# Patient Record
Sex: Female | Born: 1985 | Race: White | Hispanic: Yes | Marital: Single | Smoking: Never smoker
Health system: Southern US, Community
[De-identification: ages and names within clinical notes are randomized; demographics above are authoritative.]

---

## 2006-05-05 ENCOUNTER — Inpatient Hospital Stay (HOSPITAL_COMMUNITY): Admission: AD | Admit: 2006-05-05 | Discharge: 2006-05-05 | Payer: Self-pay | Admitting: Obstetrics and Gynecology

## 2006-07-22 ENCOUNTER — Ambulatory Visit (HOSPITAL_COMMUNITY): Admission: RE | Admit: 2006-07-22 | Discharge: 2006-07-22 | Payer: Self-pay | Admitting: Obstetrics & Gynecology

## 2006-08-09 ENCOUNTER — Inpatient Hospital Stay (HOSPITAL_COMMUNITY): Admission: AD | Admit: 2006-08-09 | Discharge: 2006-08-09 | Payer: Self-pay | Admitting: Obstetrics & Gynecology

## 2006-11-24 ENCOUNTER — Inpatient Hospital Stay (HOSPITAL_COMMUNITY): Admission: AD | Admit: 2006-11-24 | Discharge: 2006-11-24 | Payer: Self-pay | Admitting: Obstetrics and Gynecology

## 2006-11-24 ENCOUNTER — Ambulatory Visit: Payer: Self-pay | Admitting: Obstetrics and Gynecology

## 2006-11-27 ENCOUNTER — Ambulatory Visit: Payer: Self-pay | Admitting: Family Medicine

## 2006-11-29 ENCOUNTER — Ambulatory Visit: Payer: Self-pay | Admitting: Family Medicine

## 2006-11-29 ENCOUNTER — Inpatient Hospital Stay (HOSPITAL_COMMUNITY): Admission: AD | Admit: 2006-11-29 | Discharge: 2006-12-03 | Payer: Self-pay | Admitting: Obstetrics and Gynecology

## 2007-12-29 ENCOUNTER — Ambulatory Visit: Payer: Self-pay | Admitting: Family Medicine

## 2007-12-29 ENCOUNTER — Encounter: Payer: Self-pay | Admitting: Family Medicine

## 2007-12-29 LAB — CONVERTED CEMR LAB
Antibody Screen: NEGATIVE
Basophils Absolute: 0 10*3/uL (ref 0.0–0.1)
Eosinophils Relative: 1 % (ref 0–5)
HCT: 33.9 % — ABNORMAL LOW (ref 36.0–46.0)
Hemoglobin: 11.5 g/dL — ABNORMAL LOW (ref 12.0–15.0)
MCHC: 33.9 g/dL (ref 30.0–36.0)
MCV: 89.9 fL (ref 78.0–100.0)
Monocytes Relative: 5 % (ref 3–12)
RBC: 3.77 M/uL — ABNORMAL LOW (ref 3.87–5.11)
RDW: 14.3 % (ref 11.5–15.5)
Rubella antibody, serum, titer: IMMUNE

## 2008-01-05 ENCOUNTER — Ambulatory Visit: Payer: Self-pay | Admitting: Family Medicine

## 2008-01-05 LAB — CONVERTED CEMR LAB

## 2008-01-06 ENCOUNTER — Encounter: Payer: Self-pay | Admitting: Family Medicine

## 2008-01-10 ENCOUNTER — Encounter: Payer: Self-pay | Admitting: Family Medicine

## 2008-01-27 ENCOUNTER — Ambulatory Visit: Payer: Self-pay | Admitting: Obstetrics & Gynecology

## 2008-01-27 ENCOUNTER — Inpatient Hospital Stay (HOSPITAL_COMMUNITY): Admission: AD | Admit: 2008-01-27 | Discharge: 2008-01-27 | Payer: Self-pay | Admitting: Obstetrics & Gynecology

## 2008-02-01 ENCOUNTER — Ambulatory Visit: Payer: Self-pay | Admitting: Family Medicine

## 2008-02-01 ENCOUNTER — Encounter: Payer: Self-pay | Admitting: Family Medicine

## 2008-02-01 LAB — CONVERTED CEMR LAB

## 2008-02-22 ENCOUNTER — Ambulatory Visit: Payer: Self-pay | Admitting: Family Medicine

## 2008-02-24 ENCOUNTER — Ambulatory Visit: Payer: Self-pay | Admitting: Family Medicine

## 2008-02-24 LAB — CONVERTED CEMR LAB: GTT, 1 hr: 97 mg/dL

## 2008-03-01 IMAGING — US US OB COMP LESS 14 WK
1 series · 14 of 20 positions shown · non-contrast
Comparison: none

CLINICAL DATA: Abdominal pain at approximately 15 weeks gestation.  

OBSTETRICAL ULTRASOUND <14 WKS AND TRANSVAGINAL OB US:
Number of Fetuses:  1
Heart Rate:  158 bpm
CRL:  3.9 cm   10 w 6 d   US EDC:  11/25/06
Fetal anatomy could not be evaluated due to the early gestational age.
MATERNAL UTERINE AND ADNEXAL FINDINGS
Cervix:  Closed; not evaluated.

[Series 1: us ob comp +14 wk · 14 of 20 slices shown]
[im 1/20]
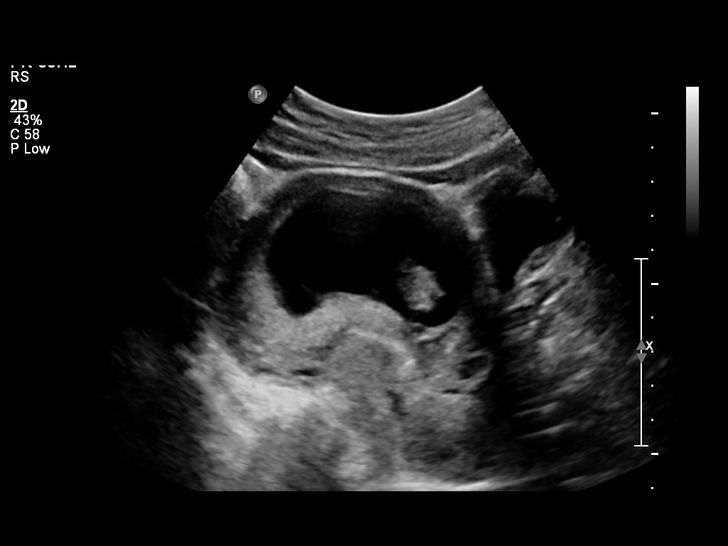
[im 3/20]
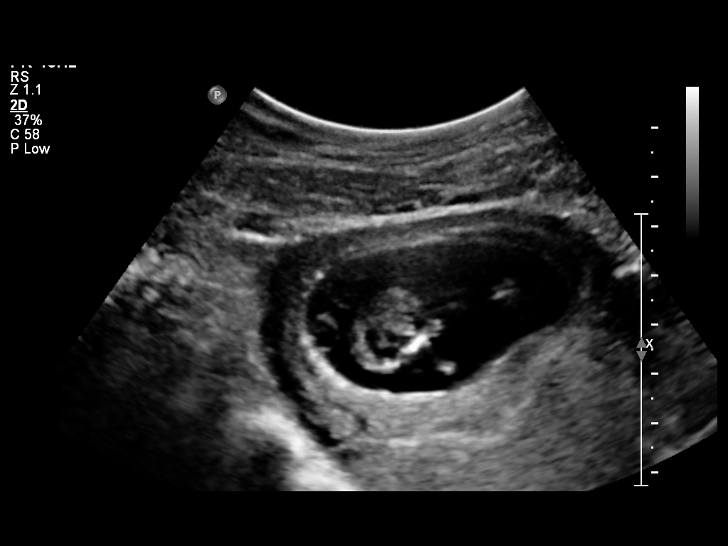
[im 4/20]
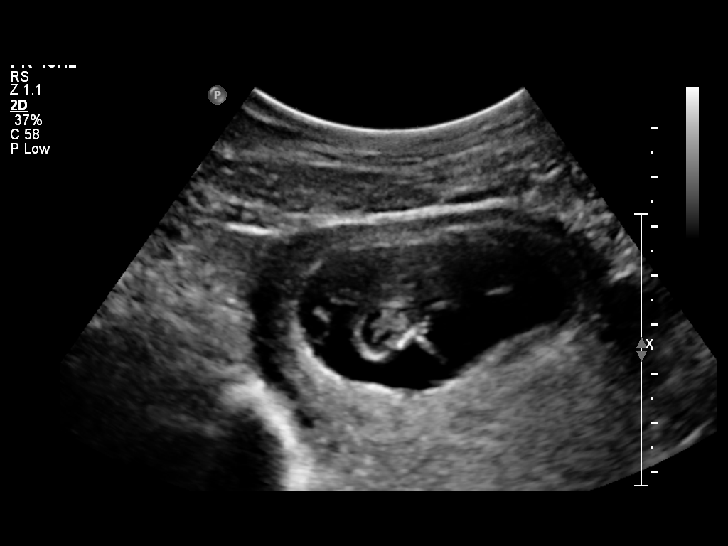
[im 6/20]
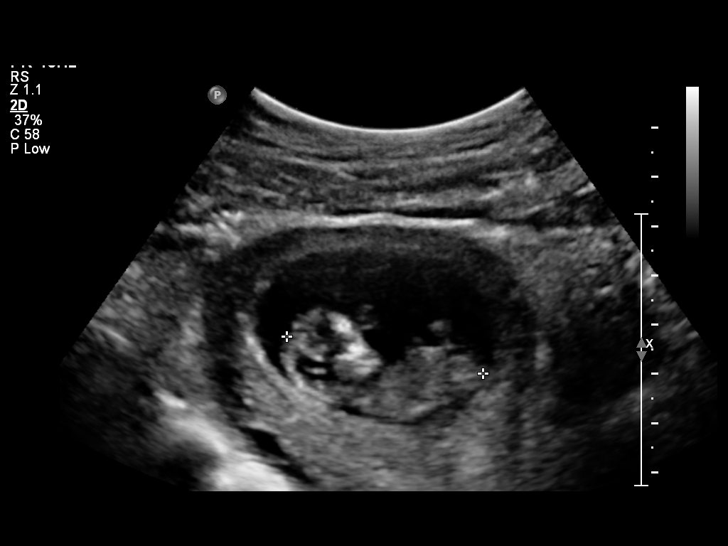
[im 7/20]
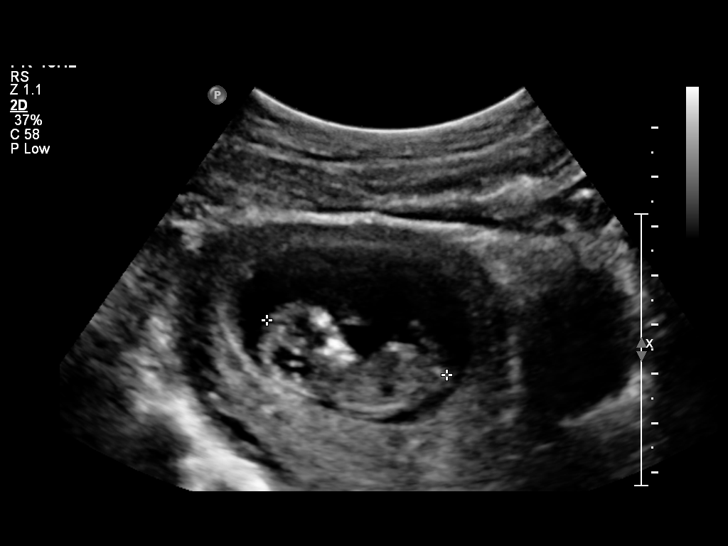
[im 8/20]
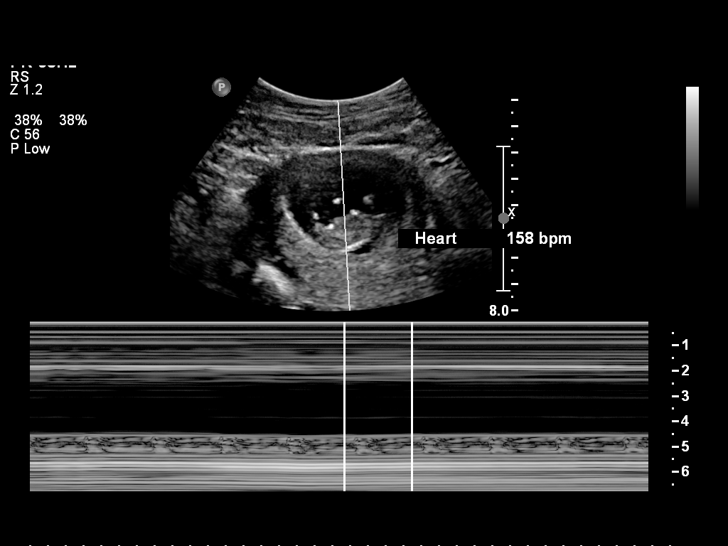
[im 10/20]
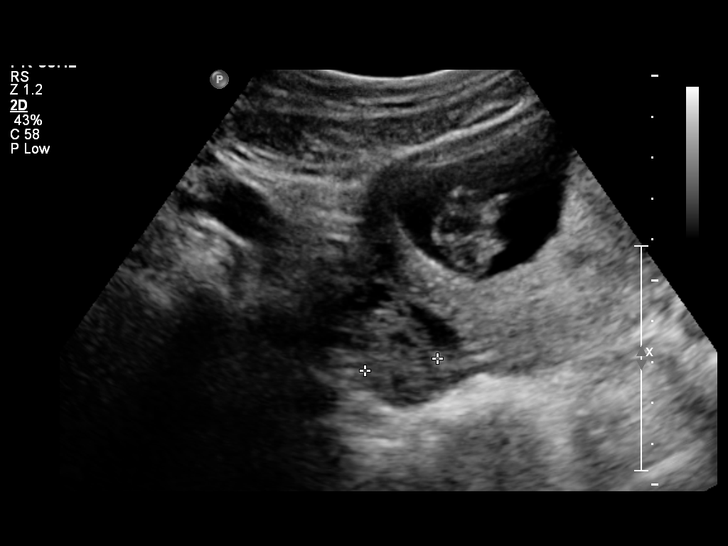
[im 11/20]
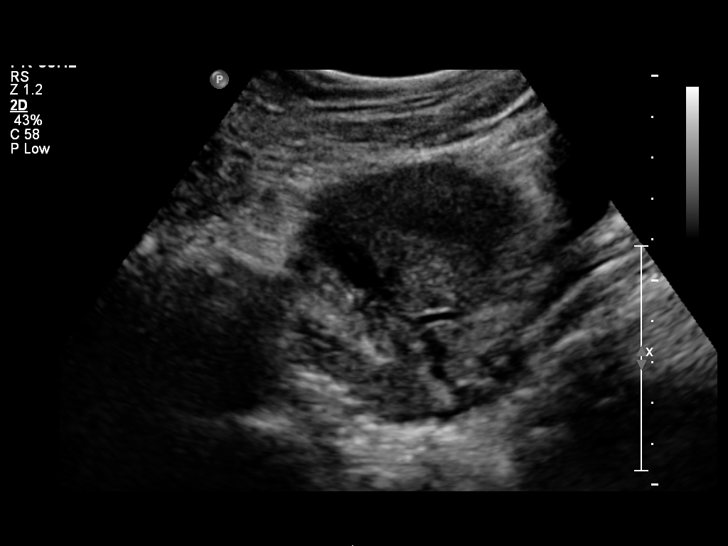
[im 13/20]
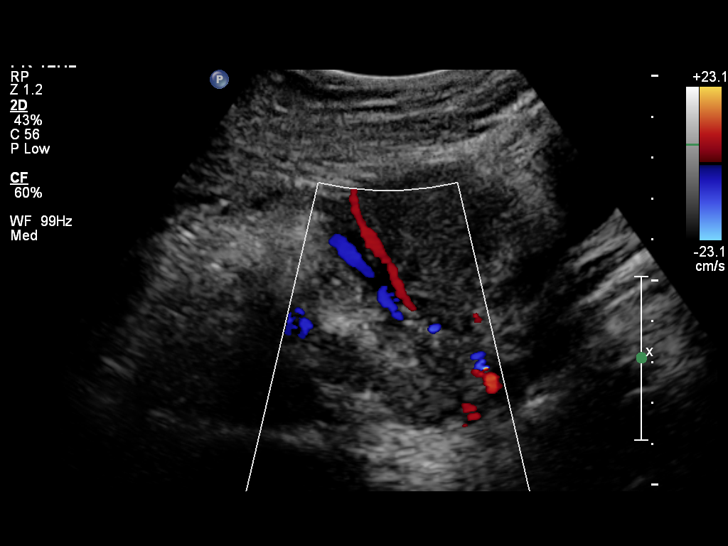
[im 14/20]
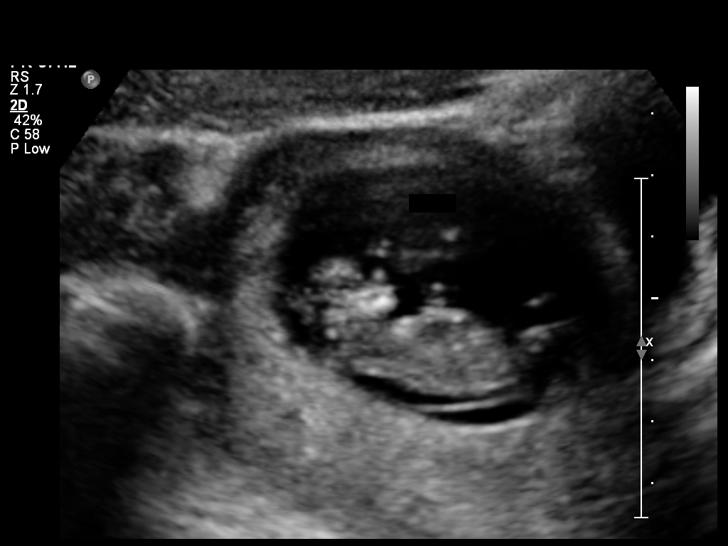
[im 16/20]
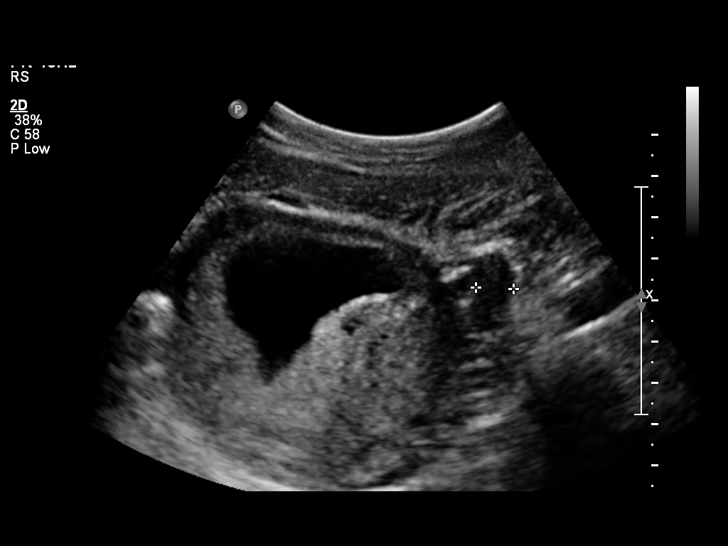
[im 17/20]
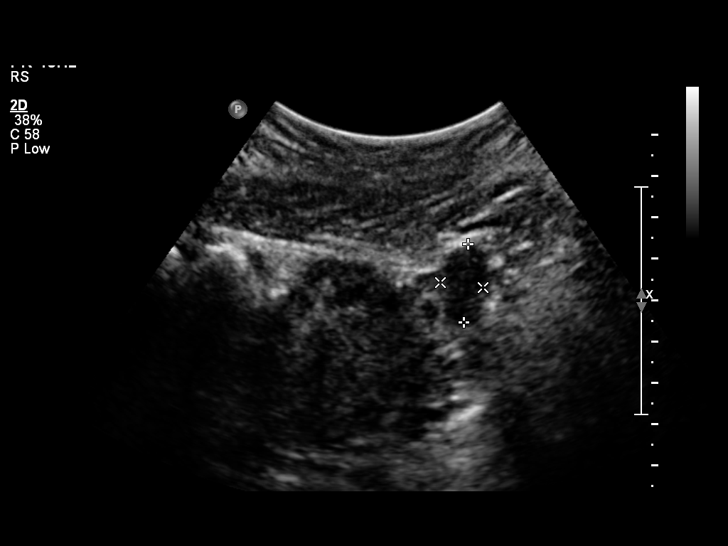
[im 18/20]
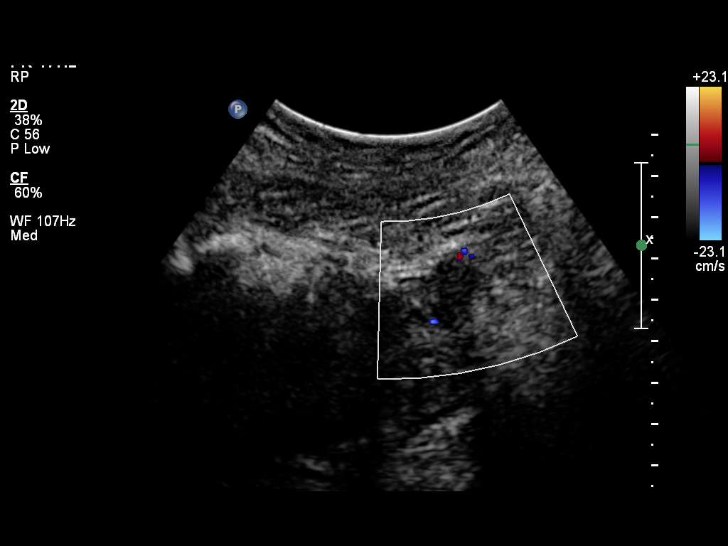
[im 20/20]
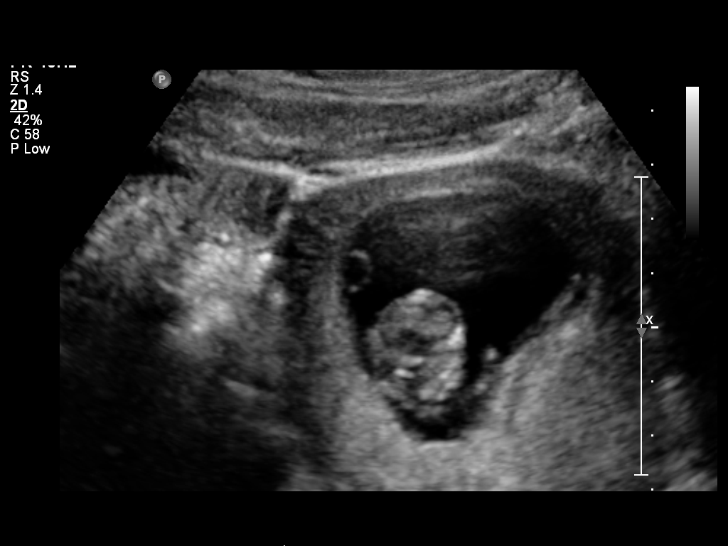

[14 of 20 positions shown; findings below may reference images not displayed]

IMPRESSION: 1.  Single living intrauterine gestation at a 10 week 6 day estimated gestational age by ultrasound.  Ultrasound EDC is 11/25/06.  This is almost 5 weeks behind the reported gestational age by LMP suggesting that LMP dating may be incorrect.  
2.  Follow-up ultrasound at 18 weeks gestation is recommended for dedicated anatomic assessment.

## 2008-03-10 ENCOUNTER — Ambulatory Visit: Payer: Self-pay | Admitting: Family Medicine

## 2008-03-27 ENCOUNTER — Encounter: Payer: Self-pay | Admitting: Family Medicine

## 2008-03-27 ENCOUNTER — Ambulatory Visit: Payer: Self-pay | Admitting: Family Medicine

## 2008-03-27 LAB — CONVERTED CEMR LAB
Bilirubin Urine: NEGATIVE
Blood in Urine, dipstick: NEGATIVE
Glucose, Urine, Semiquant: NEGATIVE
Ketones, urine, test strip: NEGATIVE
Nitrite: NEGATIVE
Protein, U semiquant: NEGATIVE
Specific Gravity, Urine: 1.025
Urobilinogen, UA: 0.2
pH: 5.5

## 2008-04-13 ENCOUNTER — Ambulatory Visit: Payer: Self-pay | Admitting: Family Medicine

## 2008-04-19 ENCOUNTER — Telehealth: Payer: Self-pay | Admitting: Family Medicine

## 2008-04-24 ENCOUNTER — Ambulatory Visit: Payer: Self-pay | Admitting: Family Medicine

## 2008-04-24 ENCOUNTER — Encounter: Payer: Self-pay | Admitting: Family Medicine

## 2008-04-24 LAB — CONVERTED CEMR LAB
Chlamydia, DNA Probe: NEGATIVE
GC Probe Amp, Genital: NEGATIVE

## 2008-05-03 ENCOUNTER — Ambulatory Visit: Payer: Self-pay | Admitting: Family Medicine

## 2008-05-11 ENCOUNTER — Ambulatory Visit: Payer: Self-pay | Admitting: Family Medicine

## 2008-05-18 ENCOUNTER — Ambulatory Visit: Payer: Self-pay | Admitting: Family Medicine

## 2008-05-18 IMAGING — US US OB COMP +14 WK
1 series · 14 of 28 positions shown · non-contrast
Comparison: none

OBSTETRICAL ULTRASOUND:

 This ultrasound exam was performed in the [HOSPITAL] Ultrasound Department.  The OB US report was generated in the AS system, and faxed to the ordering physician.  This report is also available in [REDACTED] PACS.

[Series 1: us ob comp +14 wk · 0.24mm/px · 14 of 76 slices shown]
[im 3/76]
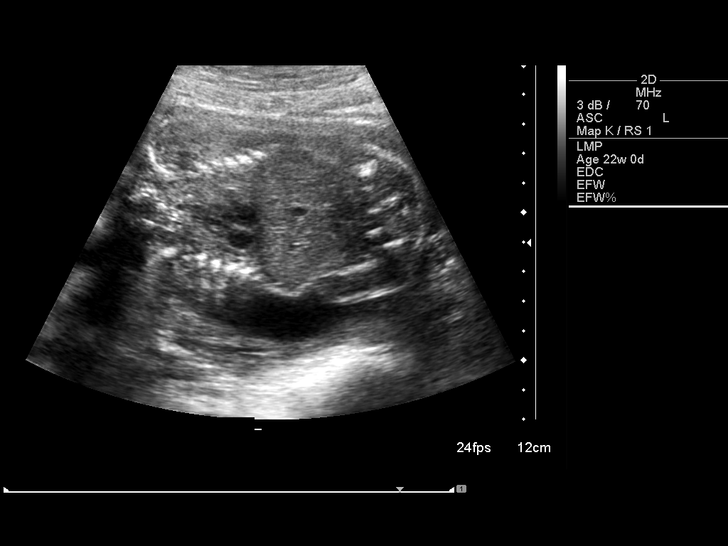
[im 9/76]
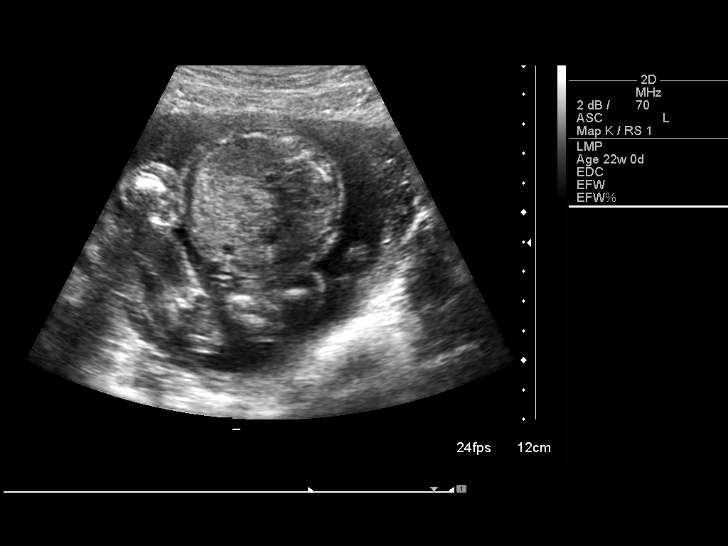
[im 14/76]
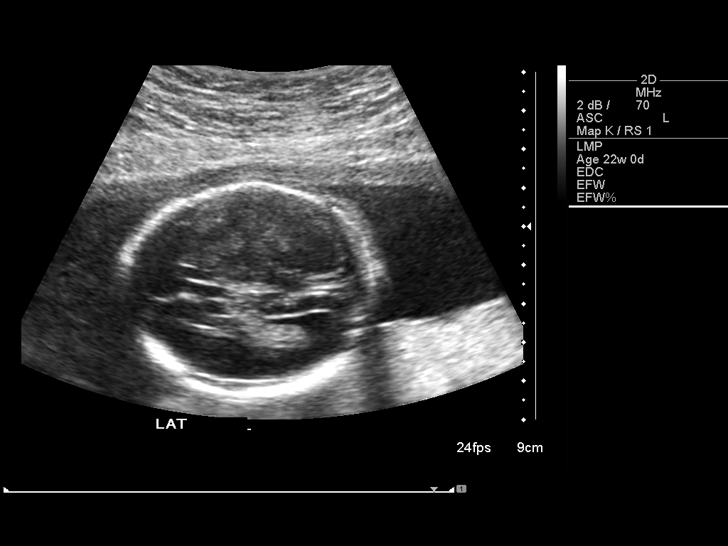
[im 20/76]
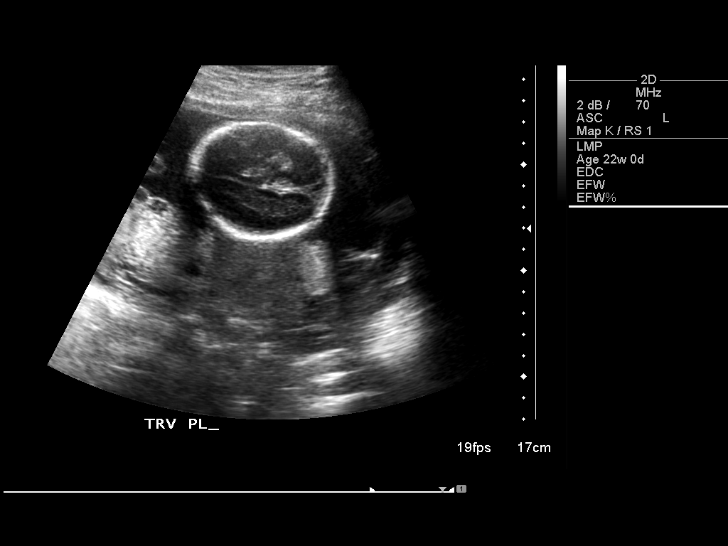
[im 26/76]
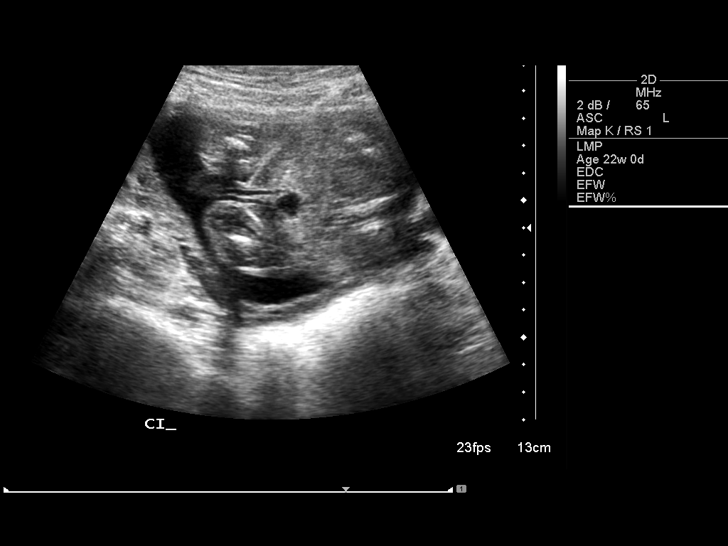
[im 31/76]
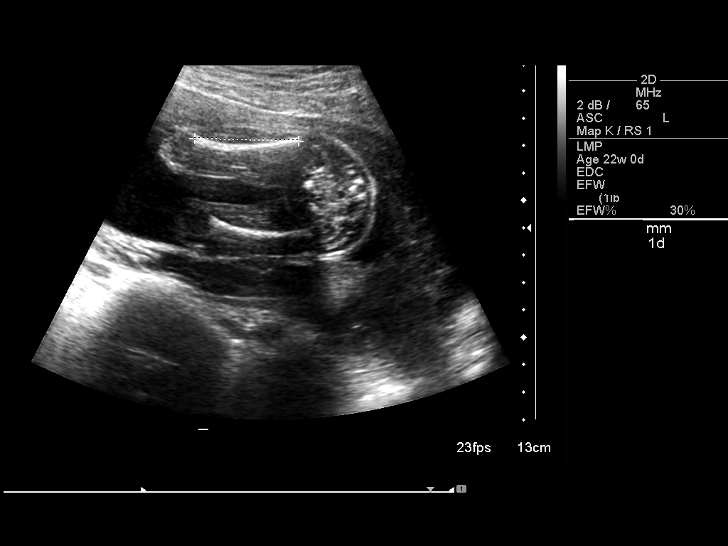
[im 37/76]
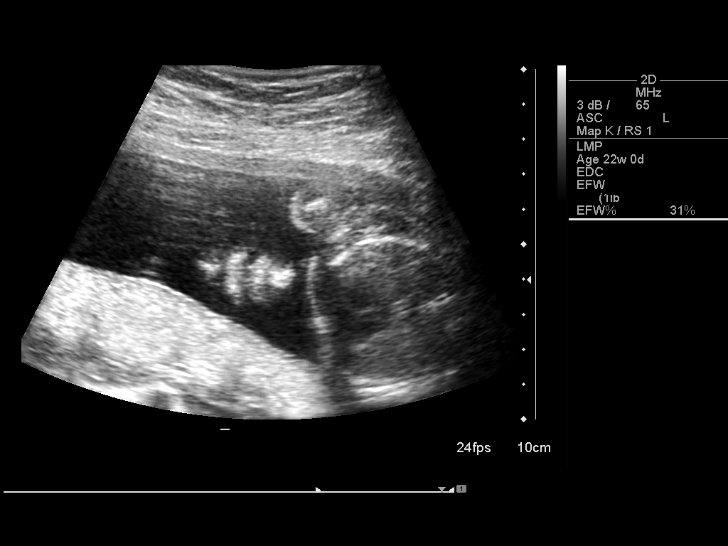
[im 42/76]
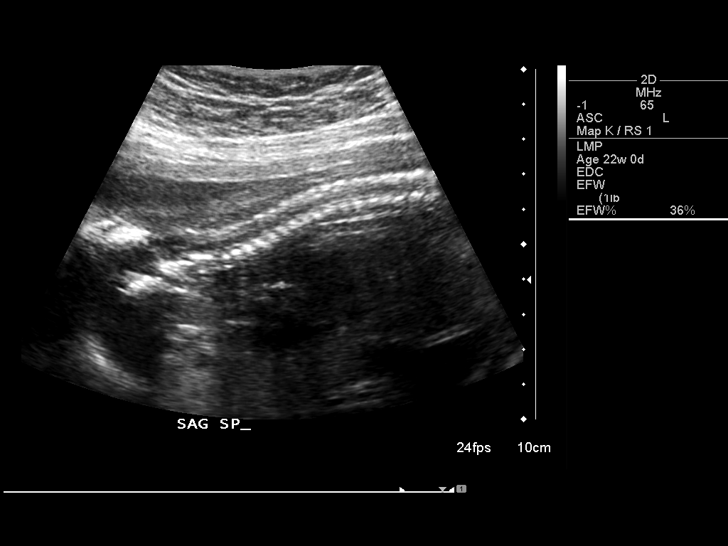
[im 48/76]
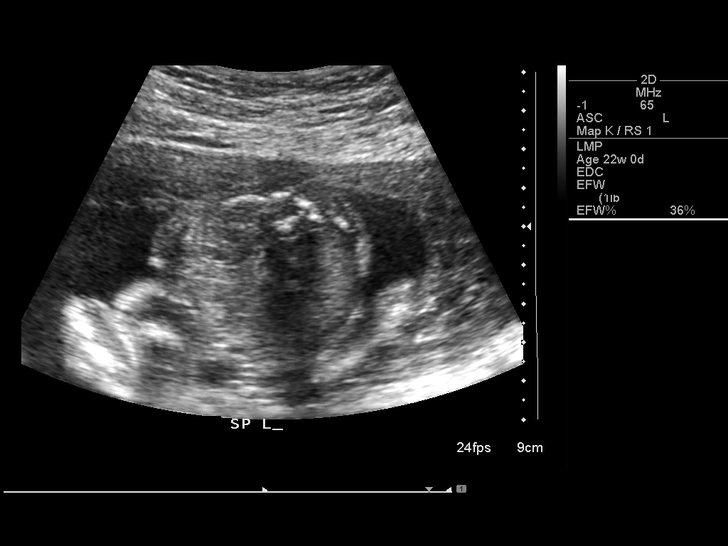
[im 53/76]
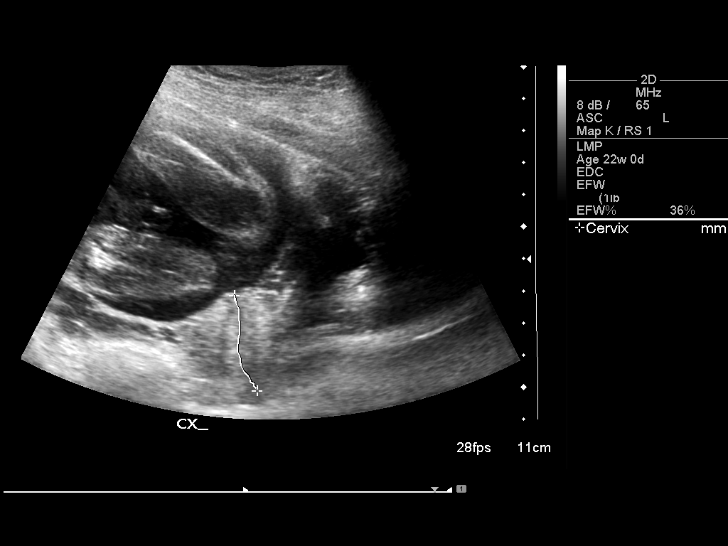
[im 59/76]
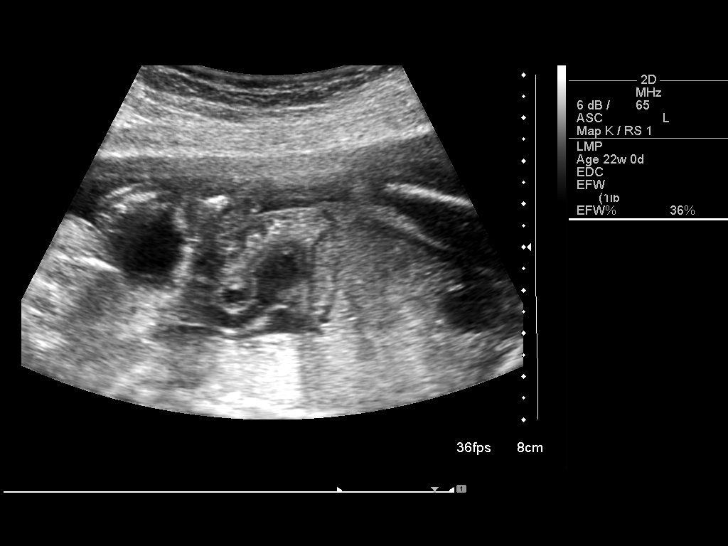
[im 64/76]
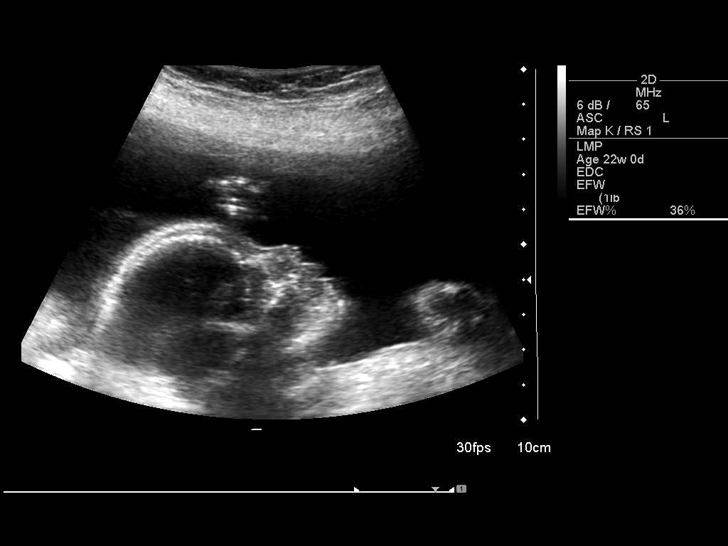
[im 70/76]
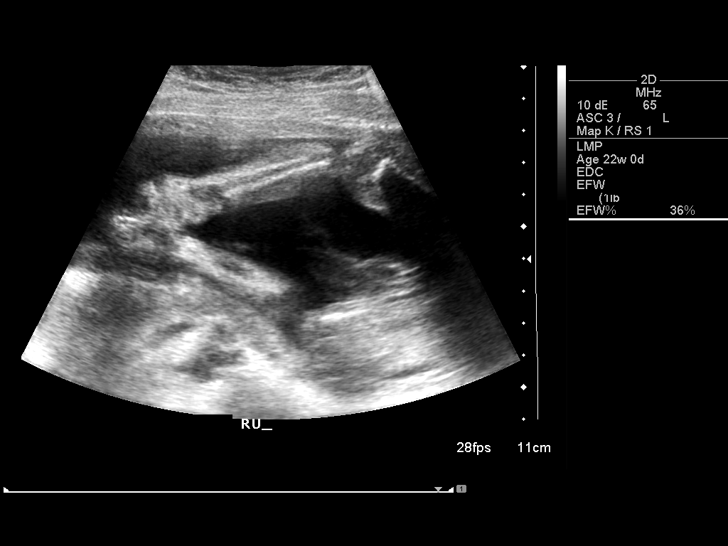
[im 76/76]
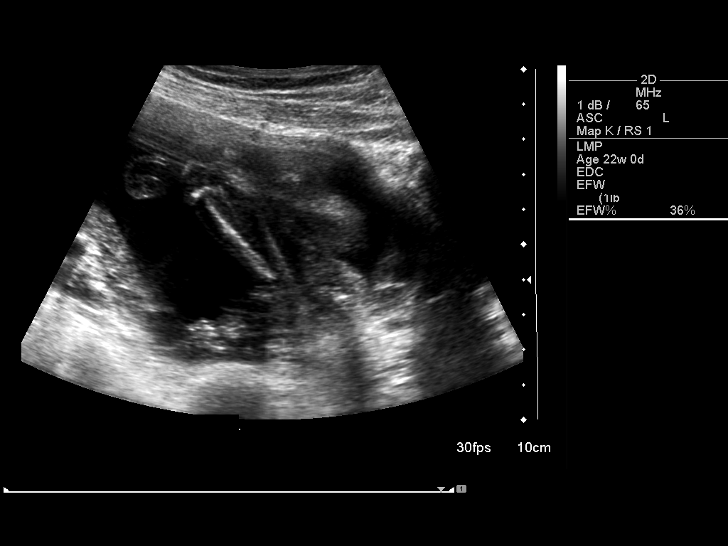

[14 of 28 positions shown; findings below may reference images not displayed]

IMPRESSION: See AS Obstetric US report.

## 2008-05-18 IMAGING — US US OB COMP +14 WK
1 series · 3 of 3 positions shown · non-contrast
Comparison: none

OBSTETRICAL ULTRASOUND:

 This ultrasound exam was performed in the [HOSPITAL] Ultrasound Department.  The OB US report was generated in the AS system, and faxed to the ordering physician.  This report is also available in [REDACTED] PACS.

[Series 1: us ob comp +14 wk · 0.22mm/px · 3 of 3 slices shown]
[im 1/3]
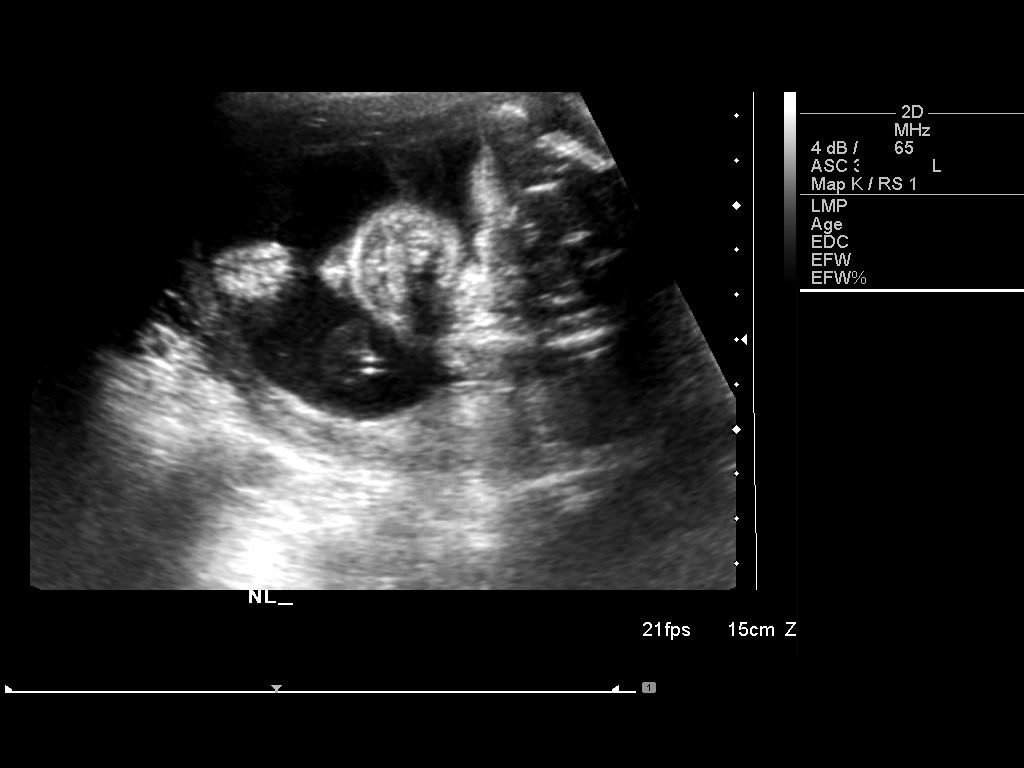
[im 2/3]
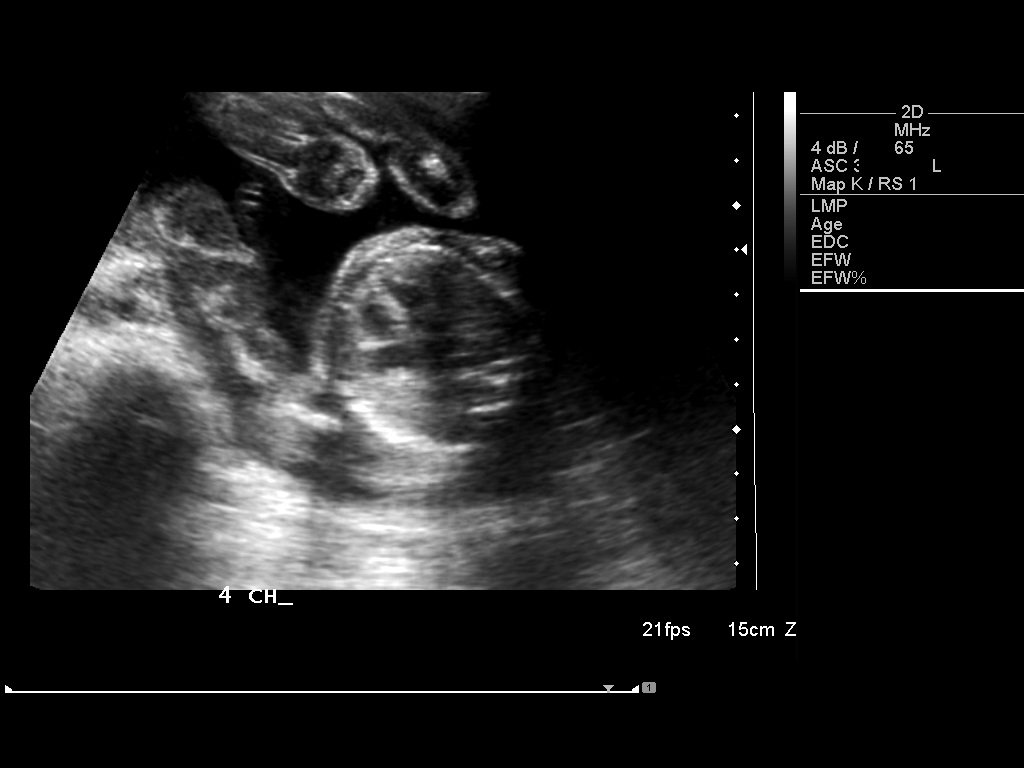
[im 3/3]
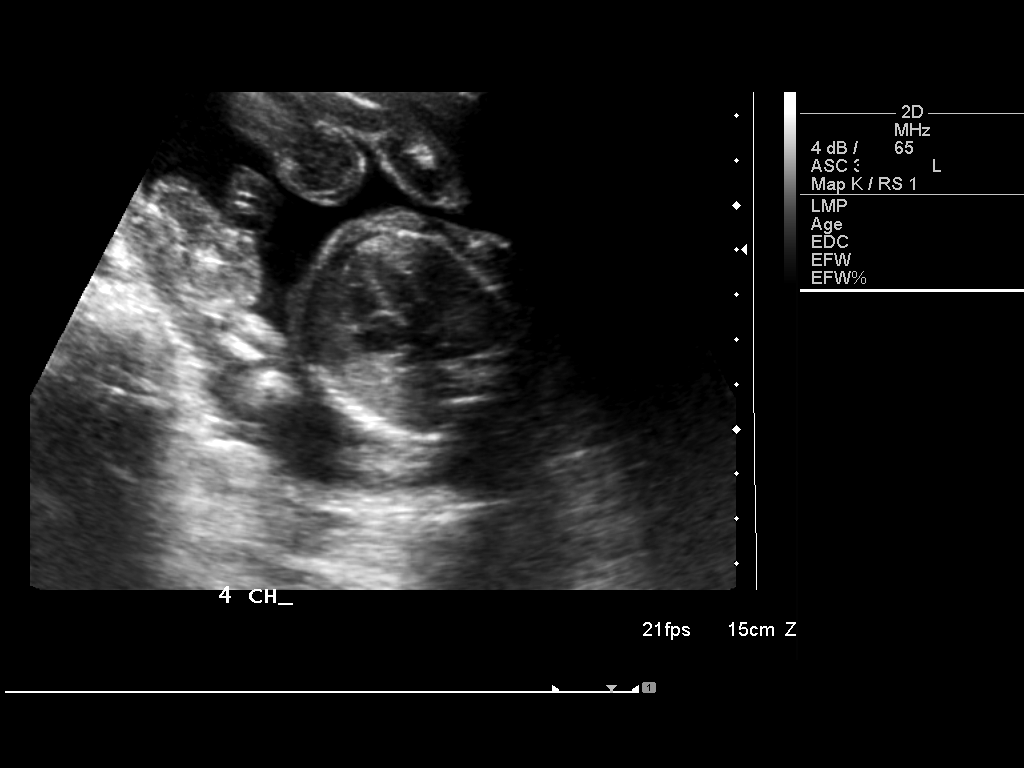

[3 of 3 positions shown; findings below may reference images not displayed]

IMPRESSION: See AS Obstetric US report.

## 2008-05-19 ENCOUNTER — Encounter: Payer: Self-pay | Admitting: Family Medicine

## 2008-05-19 ENCOUNTER — Ambulatory Visit: Payer: Self-pay | Admitting: Obstetrics and Gynecology

## 2008-05-22 ENCOUNTER — Inpatient Hospital Stay (HOSPITAL_COMMUNITY): Admission: AD | Admit: 2008-05-22 | Discharge: 2008-05-26 | Payer: Self-pay | Admitting: Obstetrics & Gynecology

## 2008-05-22 ENCOUNTER — Ambulatory Visit: Payer: Self-pay | Admitting: Family Medicine

## 2008-05-24 ENCOUNTER — Ambulatory Visit: Payer: Self-pay | Admitting: Family Medicine

## 2008-07-03 ENCOUNTER — Ambulatory Visit: Payer: Self-pay | Admitting: Family Medicine

## 2008-07-03 DIAGNOSIS — K644 Residual hemorrhoidal skin tags: Secondary | ICD-10-CM | POA: Insufficient documentation

## 2008-08-02 ENCOUNTER — Encounter: Payer: Self-pay | Admitting: Family Medicine

## 2008-08-02 ENCOUNTER — Ambulatory Visit: Payer: Self-pay | Admitting: Family Medicine

## 2008-08-02 DIAGNOSIS — K5900 Constipation, unspecified: Secondary | ICD-10-CM | POA: Insufficient documentation

## 2010-06-19 ENCOUNTER — Encounter: Payer: Self-pay | Admitting: *Deleted

## 2010-08-09 ENCOUNTER — Other Ambulatory Visit: Payer: Self-pay | Admitting: Family Medicine

## 2010-08-09 DIAGNOSIS — N644 Mastodynia: Secondary | ICD-10-CM

## 2010-08-12 LAB — CBC
HCT: 21.5 % — ABNORMAL LOW (ref 36.0–46.0)
HCT: 29.5 % — ABNORMAL LOW (ref 36.0–46.0)
Hemoglobin: 10.1 g/dL — ABNORMAL LOW (ref 12.0–15.0)
Hemoglobin: 9.8 g/dL — ABNORMAL LOW (ref 12.0–15.0)
RBC: 2.54 MIL/uL — ABNORMAL LOW (ref 3.87–5.11)
RDW: 14.4 % (ref 11.5–15.5)
RDW: 14.6 % (ref 11.5–15.5)
RDW: 15.1 % (ref 11.5–15.5)
WBC: 8.1 10*3/uL (ref 4.0–10.5)
WBC: 8.5 10*3/uL (ref 4.0–10.5)

## 2010-08-13 ENCOUNTER — Other Ambulatory Visit: Payer: Self-pay

## 2010-08-20 ENCOUNTER — Other Ambulatory Visit: Payer: Self-pay

## 2010-09-10 NOTE — Discharge Summary (Signed)
NAMECHERYEL, KYTE      ACCOUNT NO.:  [id number]   MEDICAL RECORD NO.:  [id number]          PATIENT TYPE:  INP   LOCATION:  9142                          FACILITY:  [facility]   PHYSICIAN:  Phil D. Okey Dupre, M.D.     DATE OF BIRTH:  01-Apr-1986   DATE OF ADMISSION:  11/29/2006  DATE OF DISCHARGE:  12/03/2006                               DISCHARGE SUMMARY   ADDENDUM TO DISCHARGE SUMMARY:  Reference number to previous dictation  to which this is an addendum is dictation [id number].   This is an addendum to a discharge summary.   REASON FOR ADDENDUM:  Immediately prior to leaving the hospital on  discharge, the patient was noted to have a temperature of 100.5 and felt  feverish as well.  The patient was therefore kept in the hospital for an  additional day, was started on ampicillin, gentamicin, and clindamycin.  A urinalysis, Gram stain, and urine culture were sent.  Results from the  urinalysis were unremarkable being negative for nitrites, showing trace  blood, trace leukocytes.  Urine Gram stain showed no white blood cells,  no epithelial cells, and no organisms were seen.  Urine culture at the  time of discharge had been re-incubated for better growth but no final  read had been reported.  After the isolated temperature of 100.5  immediately prior to discharge, the patient remained afebrile for  greater than 24 hours.  It was then deemed the patient was fit for  discharge at approximately 1430 hours on December 02, 2006.  Having been  afebrile again for greater than 24 hours.   PENDING LABORATORY:  Issues to be resolved:  The patient has a urine  culture that is pending at the time of discharge.   DISCHARGE MEDICATIONS:  The patient is being sent home on:  1. Micronor for birth control, one tab daily.  2. The patient is also to take Motrin 600 mg one pill every 6 hours      p.r.n. pain.  3. The patient is also to take Colace 100 mg p.o. twice daily p.r.n.      constipation.  4. The  patient is to continue taking prenatal vitamins daily.   She is to maintain pelvic rest for 6 weeks.  The patient is also to not undertake any heavy lifting for 6 weeks.  The patient is to follow up at the [facility] Department in  6 weeks as well.  The patient was instructed to return for any symptoms of hypovolemia or  possible infection including fevers, chills, presyncopal symptoms,  dizziness, lightheadedness, or feeling faint.   DISCHARGE CONDITION:  Improved and stable.      Myrtie Soman, MD      Javier Glazier. Okey Dupre, M.D.  Electronically Signed    TE/MEDQ  D:  12/03/2006  T:  12/03/2006  Job:  [id number]

## 2010-09-10 NOTE — Discharge Summary (Signed)
Rose Wells, Rose Wells      ACCOUNT NO.:  [id number]   MEDICAL RECORD NO.:  [id number]          PATIENT TYPE:  INP   LOCATION:  9142                          FACILITY:  [facility]   PHYSICIAN:  Phil D. Okey Dupre, M.D.     DATE OF BIRTH:  06/12/1985   DATE OF ADMISSION:  11/29/2006  DATE OF DISCHARGE:  12/02/2006                               DISCHARGE SUMMARY   ADMITTING DIAGNOSIS:  Active labor with mild pre-eclampsia.   DISCHARGE DIAGNOSES:  Status post spontaneous vaginal delivery,  complicated by a postpartum hemorrhage, requiring transfusion of three  units of packed red blood cells.   PERTINENT LABORATORY DATA:  On admission, the patient had a complete  metabolic panel performed that was significant for an alkaline  phosphatase of 170, and was otherwise normal.  Liver function tests were  within normal limits.  LDH on admission, August 3, was 174.  Uric acid  was 6.5.  Urine dip stick showed 30 mg/dl of protein and was otherwise  negative.  CBC on admission showed a white blood cell count of 7.7, a  hemoglobin of 11.8, hematocrit of 33.7.   After delivery on August 4 at approximately 3 a.m., CBC was drawn that  showed a hemoglobin of 9.1, consistent with a postpartum hemorrhage.  Hemoglobin was repeated later that day at 1:15 p.m. and was noted to be  7.9.  Also at 1:15, post-delivery on August 4, patient had a complete  metabolic panel repeated that was normal except for a mildly elevated  AST to 145.  Metabolic panel was otherwise normal.  LDH was also  repeated at this time and was found to be slightly elevated at 277.  Uric acid was repeated also at this time and was found to be 7.6.  At  this time, the patient was cross-matched and received three units of  packed red blood cells.  On the morning after transfusion, patient had a  hemoglobin repeated and hemoglobin had increased to 9.3.  This was four  hours after transfusion.  On the morning of August 5, complete metabolic  panel was repeated and was within normal limits.  LFTs were within  normal limits.  Also on the morning of August 5, uric acid was 7.4.   HOSPITAL COURSE:  On November 29, 2006, the patient was admitted in active  labor with mild pre-eclampsia, as evidenced by 30 mg/dl of protein,  found on a urinalysis.  Patient was otherwise stable.  On the early  morning of August 4, patient delivered a healthy female at 7 pounds 3  ounces with Apgars of 8 and 9 via spontaneous vaginal delivery.  Delivery, however, was complicated by a postpartum hemorrhage after  delivery of a three-vessel cord placenta.  Estimated blood loss from  this hemorrhage was approximately 1500 mL.  Patient was administered  Hemabate 250 micrograms intramuscularly three times and received  bimanual compression for 15 minutes.  Uterus was noted to be firm after  this compression.  Also, a left labial laceration was repaired with  local anesthesia with 3-0 Monocryl.  Soon after delivery, patient had a  CBC drawn that was remarkable for  a hemoglobin of 9.1, down from 11.8  prior to delivery.  Later on the day of delivery on August 4 at 1:15,  hemoglobin was repeated and was found to be 7.9.  At this time, the  patient was transfused with three units of packed red blood cells and  hemoglobin was repeated four hours after transfusion of these red cells.  Repeat hemoglobin on the morning of August 5 at approximately 8 a.m.  showed a hemoglobin of 9.3.   Throughout the rest of the patient's hospital course, she was stable  with no signs of hypovolemia.  Vital signs remained stable.  On the date  of discharge, patient remained stable with a systolic blood pressure of  107 and diastolic blood pressure of 60.  Patient had no physical  complaints or symptoms on exam of hypovolemia and was discharged to  home.   DISCHARGE INSTRUCTIONS:  1. The patient was instructed to follow up at the [facility] Department in six  weeks, or sooner if the patient      experiences any symptoms of hypovolemia, light-headedness,      dizziness, paleness, fatigue or presyncopal symptoms.  2. The patient was instructed to return for any concerning symptoms,      specifically symptoms of light-headedness, dizziness, increased      heart rate or presyncopal symptoms and other signs of hypovolemia.  3. The patient was instructed to take Micronor oral contraceptive      pills while breast-feeding.  4. The patient was instructed to take Motrin 600 mg by mouth every 6      hours as needed for pain.  5. Colace 100 mg b.i.d. p.r.n. constipation.  6. Patient is to continue taking prenatal vitamins.  7. The patient is to maintain pelvic rest for six weeks with nothing      in the vagina over this time.  8. The patient is to not undertake any heavy lifting for six weeks.   DISCHARGE MEDICATIONS:  1. Patient is discharged home on prenatal vitamins by mouth daily.  2. Motrin 600 mg by mouth every 6 hours p.r.n. pain.  3. Colace 100 mg p.o. b.i.d. p.r.n. constipation.  4. Micronor one tab daily for oral contraception.   DISCHARGE DISPOSITION:  Patient is discharged to home and instructed to  follow up at [facility] Department in six weeks.   DISCHARGE CONDITION:  Stable.      Myrtie Soman, MD      Javier Glazier. Okey Dupre, M.D.  Electronically Signed    TE/MEDQ  D:  12/02/2006  T:  12/02/2006  Job:  [id number]

## 2011-01-27 LAB — WET PREP, GENITAL

## 2011-01-27 LAB — PROTIME-INR: INR: 0.9

## 2011-01-27 LAB — CBC
HCT: 34.4 — ABNORMAL LOW
Hemoglobin: 11.7 — ABNORMAL LOW
MCV: 95
RBC: 3.62 — ABNORMAL LOW
WBC: 9.6

## 2011-01-27 LAB — GC/CHLAMYDIA PROBE AMP, GENITAL: GC Probe Amp, Genital: NEGATIVE

## 2011-01-27 LAB — URINALYSIS, ROUTINE W REFLEX MICROSCOPIC
Bilirubin Urine: NEGATIVE
Ketones, ur: NEGATIVE
Nitrite: NEGATIVE
Specific Gravity, Urine: >1.03 — ABNORMAL HIGH
Urobilinogen, UA: 0.2
pH: 6

## 2011-01-27 LAB — URINE MICROSCOPIC-ADD ON: RBC / HPF: NONE SEEN

## 2011-01-27 LAB — APTT: aPTT: 29

## 2011-01-28 LAB — GLUCOSE, CAPILLARY: Glucose-Capillary: 97

## 2011-02-10 LAB — URIC ACID
Uric Acid, Serum: 7.4 — ABNORMAL HIGH
Uric Acid, Serum: 7.4 — ABNORMAL HIGH

## 2011-02-10 LAB — CBC
HCT: 25.7 — ABNORMAL LOW
Hemoglobin: 7.9 — CL
MCHC: 35.3
MCV: 94.2
Platelets: 174
RBC: 2.39 — ABNORMAL LOW
RBC: 2.73 — ABNORMAL LOW
RDW: 12.6
WBC: 22.1 — ABNORMAL HIGH
WBC: 23.1 — ABNORMAL HIGH
WBC: 7.7

## 2011-02-10 LAB — GRAM STAIN: Gram Stain: NONE SEEN

## 2011-02-10 LAB — COMPREHENSIVE METABOLIC PANEL
ALT: 15
AST: 27
AST: 34
AST: 45 — ABNORMAL HIGH
Albumin: 3 — ABNORMAL LOW
Alkaline Phosphatase: 113
Alkaline Phosphatase: 170 — ABNORMAL HIGH
BUN: 6
CO2: 21
CO2: 24
CO2: 27
Calcium: 7.1 — ABNORMAL LOW
Calcium: 7.3 — ABNORMAL LOW
Calcium: 7.9 — ABNORMAL LOW
Chloride: 104
Chloride: 104
Chloride: 105
Creatinine, Ser: 0.62
Creatinine, Ser: 0.81
GFR calc Af Amer: >60
GFR calc Af Amer: >60
GFR calc Af Amer: >60
GFR calc Af Amer: >60
GFR calc non Af Amer: >60
GFR calc non Af Amer: >60
GFR calc non Af Amer: >60
Glucose, Bld: 144 — ABNORMAL HIGH
Glucose, Bld: 91
Potassium: 3.6
Potassium: 3.6
Sodium: 132 — ABNORMAL LOW
Total Bilirubin: 0.9
Total Bilirubin: 1
Total Bilirubin: 1.2
Total Protein: 4.4 — ABNORMAL LOW
Total Protein: 6.3

## 2011-02-10 LAB — LACTATE DEHYDROGENASE: LDH: 277 — ABNORMAL HIGH

## 2011-02-10 LAB — URINALYSIS, ROUTINE W REFLEX MICROSCOPIC
Ketones, ur: NEGATIVE
Nitrite: NEGATIVE
Protein, ur: NEGATIVE
Urobilinogen, UA: 0.2

## 2011-02-10 LAB — CROSSMATCH: Antibody Screen: NEGATIVE

## 2011-02-10 LAB — HEMOGLOBIN AND HEMATOCRIT, BLOOD: Hemoglobin: 9.3 — ABNORMAL LOW

## 2011-02-10 LAB — RPR: RPR Ser Ql: NONREACTIVE

## 2011-02-10 LAB — URINALYSIS, DIPSTICK ONLY
Bilirubin Urine: NEGATIVE
Glucose, UA: NEGATIVE
Protein, ur: 30 — AB
Specific Gravity, Urine: 1.025
Urobilinogen, UA: 0.2

## 2015-07-06 ENCOUNTER — Encounter (HOSPITAL_COMMUNITY): Payer: Self-pay | Admitting: *Deleted

## 2015-07-12 ENCOUNTER — Other Ambulatory Visit (HOSPITAL_COMMUNITY): Payer: Self-pay | Admitting: *Deleted

## 2015-07-12 ENCOUNTER — Encounter (HOSPITAL_COMMUNITY): Payer: Self-pay

## 2015-07-12 ENCOUNTER — Ambulatory Visit (HOSPITAL_COMMUNITY)
Admission: RE | Admit: 2015-07-12 | Discharge: 2015-07-12 | Disposition: A | Discharge status: Home / Self Care | Payer: Self-pay | Source: Ambulatory Visit | Attending: Obstetrics and Gynecology | Admitting: Obstetrics and Gynecology

## 2015-07-12 VITALS — BP 108/64 | Temp 98.0°F | Ht 62.0 in | Wt 159.0 lb

## 2015-07-12 DIAGNOSIS — N644 Mastodynia: Secondary | ICD-10-CM

## 2015-07-12 DIAGNOSIS — Z1239 Encounter for other screening for malignant neoplasm of breast: Secondary | ICD-10-CM

## 2015-07-12 DIAGNOSIS — R87612 Low grade squamous intraepithelial lesion on cytologic smear of cervix (LGSIL): Secondary | ICD-10-CM

## 2015-07-12 NOTE — Progress Notes (Signed)
[facility] due to having an abnormal Pap smear on 06/13/2015 that a colposcopy is recommended for follow up. Complaints of occasional bilateral breast pain that is more frequent in the left breast. Patient rates pain at a 5 out of 10.  Pap Smear:  Pap smear not completed today. Last Pap smear [facility]13/2015 [facility] and LGS[facility]. [facility] for a colposcopy. Appointment scheduled for Wednesday, August 08, 2015 at 1445. Per patient has no history of abnormal Pap smear prior to the most recent Pap smear. Last Pap smear result is in EPIC.  Physical exam: Breasts Breasts symmetrical. No skin abnormalities bilateral breasts. No nipple retraction bilateral breasts. No nipple discharge bilateral breasts. No lymphadenopathy. No lumps palpated bilateral breasts. Complaints of left breast tenderness at 6 o'clock on exam. [facility] breast [facility]. Appointment scheduled for Tuesday, July 17, 2015 at 1300.  Pelvic/Bimanual No Pap smear completed today since last Pap smear was 06/13/2015. Pap smear not indicated per BCCCP guidelines.   Smoking History: Patient has never smoked.  Patient Navigation: Patient education provided. Access to services provided [facility].

## 2015-07-12 NOTE — Addendum Note (Signed)
Encounter addended by: Priscille Heidelberghristine P Eulan Heyward, RN on: 07/12/2015  3:55 PM<BR>     Documentation filed: ED Follow-Up

## 2015-07-12 NOTE — Patient Instructions (Addendum)
Educational materials on breast self awareness given. Explained the colposcopy to Rose Wells the needed follow up for her abnormal Pap smear on 06/13/2015. [facility] for a colpos[facility]. Appointment scheduled for Wednesday, August 08, 2015 at 1445. [facility] for a left breast ultrasound. Appointment scheduled for Tuesday, July 17, 2015 at 1300. Patient aware of appointments and will be there. Rose Wells verbalized understanding.  Kalani Baray, Kathaleen Maserhristine Poll, RN 12:49 PM

## 2015-07-17 ENCOUNTER — Ambulatory Visit
Admission: RE | Admit: 2015-07-17 | Discharge: 2015-07-17 | Disposition: A | Discharge status: Home / Self Care | Payer: No Typology Code available for payment source | Source: Ambulatory Visit | Attending: Obstetrics and Gynecology | Admitting: Obstetrics and Gynecology

## 2015-07-17 ENCOUNTER — Other Ambulatory Visit (HOSPITAL_COMMUNITY): Payer: Self-pay | Admitting: Obstetrics and Gynecology

## 2015-07-17 DIAGNOSIS — N644 Mastodynia: Secondary | ICD-10-CM

## 2015-07-25 ENCOUNTER — Encounter (HOSPITAL_COMMUNITY): Payer: Self-pay | Admitting: *Deleted

## 2015-08-08 ENCOUNTER — Ambulatory Visit (INDEPENDENT_AMBULATORY_CARE_PROVIDER_SITE_OTHER): Payer: Self-pay | Admitting: Obstetrics & Gynecology

## 2015-08-08 ENCOUNTER — Encounter: Payer: Self-pay | Admitting: Obstetrics & Gynecology

## 2015-08-08 ENCOUNTER — Other Ambulatory Visit (HOSPITAL_COMMUNITY)
Admission: RE | Admit: 2015-08-08 | Discharge: 2015-08-08 | Disposition: A | Discharge status: Home / Self Care | Payer: No Typology Code available for payment source | Source: Ambulatory Visit | Attending: Obstetrics & Gynecology | Admitting: Obstetrics & Gynecology

## 2015-08-08 VITALS — BP 109/64 | HR 61 | Wt 158.2 lb

## 2015-08-08 DIAGNOSIS — R87612 Low grade squamous intraepithelial lesion on cytologic smear of cervix (LGSIL): Secondary | ICD-10-CM | POA: Insufficient documentation

## 2015-08-08 LAB — POCT PREGNANCY, URINE: PREG TEST UR: NEGATIVE

## 2015-08-08 NOTE — Patient Instructions (Addendum)
COLPOSCOPY POST-PROCEDURE INSTRUCTIONS  1. You may take Ibuprofen, Aleve or Tylenol for cramping if needed.  2. If Monsel's solution was used, you will have a black discharge.  3. Light bleeding is normal.  If bleeding is heavier than your period, please call.  4. Put nothing in your vagina until the bleeding or discharge stops (usually 2 or 3 days).  5. We will call you within one week with biopsy results or discuss the results at your follow-up appointment if needed.  

## 2015-08-08 NOTE — Progress Notes (Signed)
    GYNECOLOGY CLINIC COLPOSCOPY PROCEDURE NOTE  30 y.o. G0P0000 here for colposcopy for low-grade squamous intraepithelial neoplasia (LGSIL - encompassing HPV,mild dysplasia,CIN I) pap [facility].  Was evaluated by [facility]CP on 07/12/15 and referred here for colposcopy.  Discussed role for HPV in cervical dysplasia, need for surveillance.  Patient given informed consent, signed copy in the chart, time out was performed.  Placed in lithotomy position. Cervix viewed with speculum and colposcope after application of acetic acid.   Colposcopy adequate? Yes No visible lesions.  ECC specimen obtained and sent to pathology.  Patient was given post procedure instructions.  Will follow up pathology and manage accordingly.  Routine preventative health maintenance measures emphasized.   Rose CollinsUGONNA  Kalea Perine, MD, FACOG Attending Obstetrician & Gynecologist, [facility]

## 2015-08-13 ENCOUNTER — Telehealth: Payer: Self-pay | Admitting: *Deleted

## 2015-08-13 NOTE — Telephone Encounter (Signed)
-----   Message from Tereso NewcomerUgonna A Anyanwu, MD sent at 08/13/2015  3:23 PM EDT ----- Colposcopy pathology shows possible low grade lesion.  Will need to repeat pap and HPV test in one year.  Please call to inform patient of results and recommendations.

## 2015-08-13 NOTE — Telephone Encounter (Signed)
Attempted to call patient using Spanish interpreter [phone]. There was no answer. Voice mail left stating I am calling with non urgent test results. Please return my call at the clinic.

## 2015-08-24 NOTE — Telephone Encounter (Signed)
Called pt and informed her of Colposcopy result as well as recommendation for repeat Pap w/HPV testing in 1 year. Pt voiced understanding.

## 2017-01-22 ENCOUNTER — Encounter (HOSPITAL_COMMUNITY): Payer: Self-pay | Admitting: *Deleted

## 2017-01-22 ENCOUNTER — Encounter (HOSPITAL_COMMUNITY): Payer: Self-pay

## 2017-01-22 ENCOUNTER — Ambulatory Visit (HOSPITAL_COMMUNITY)
Admission: RE | Admit: 2017-01-22 | Discharge: 2017-01-22 | Disposition: A | Discharge status: Home / Self Care | Payer: No Typology Code available for payment source | Source: Ambulatory Visit | Attending: Obstetrics and Gynecology | Admitting: Obstetrics and Gynecology

## 2017-01-22 VITALS — BP 92/60 | Ht 65.0 in | Wt 166.0 lb

## 2017-01-22 DIAGNOSIS — Z01419 Encounter for gynecological examination (general) (routine) without abnormal findings: Secondary | ICD-10-CM

## 2017-01-22 NOTE — Progress Notes (Signed)
No complaints today.   Pap Smear: Pap smear completed today. Last Pap smear was 06/13/2015 at the [facility] and LGSIL. Patient had a colposcopy completed 08/08/2015 that showed a possible low grade lesion. Per patient her last Pap smear is the only abnormal Pap smear she has had. Last Pap smear result is in EPIC.  Physical exam: Breasts Breasts symmetrical. No skin abnormalities bilateral breasts. No nipple retraction bilateral breasts. No nipple discharge bilateral breasts. No lymphadenopathy. No lumps palpated bilateral breasts. No complaints of pain or tenderness on exam. Screening mammogram recommended at age 87 unless clinically indicated prior.  Pelvic/Bimanual   Ext Genitalia No lesions, no swelling and no discharge observed on external genitalia.         Vagina Vagina pink and normal texture. No lesions or discharge observed in vagina.          Cervix Cervix is present. Cervix pink and of normal texture. Cervix friable. No discharge observed.     Uterus Uterus is present and palpable. Uterus in normal position and normal size.        Adnexae Bilateral ovaries present and palpable. No tenderness on palpation.          Rectovaginal No rectal exam completed today since patient had no rectal complaints. No skin abnormalities observed on exam.    Smoking History: Patient has never smoked.  Patient Navigation: Patient education provided. Access to services provided for patient through [facility] program.

## 2017-01-22 NOTE — Addendum Note (Signed)
Encounter addended by: Lynnell Dike, LPN on: 05/16/1476 12:54 PM<BR>    Actions taken: Order list changed

## 2017-01-22 NOTE — Patient Instructions (Signed)
Explained breast self awareness with Washington T Vazquez-Riano. Let patient know that if today's Pap smear is normal that her next Pap smear will be due in one year. Let patient know will follow up with her within the next couple weeks with results of Pap smear by letter or phone. Washington T Vazquez-Riano verbalized understanding.  Sulema Braid, Kathaleen Maser, RN 12:19 PM

## 2017-01-27 ENCOUNTER — Telehealth (HOSPITAL_COMMUNITY): Payer: Self-pay | Admitting: *Deleted

## 2017-01-27 ENCOUNTER — Other Ambulatory Visit (HOSPITAL_COMMUNITY): Payer: Self-pay | Admitting: *Deleted

## 2017-01-27 DIAGNOSIS — N760 Acute vaginitis: Principal | ICD-10-CM

## 2017-01-27 DIAGNOSIS — B9689 Other specified bacterial agents as the cause of diseases classified elsewhere: Secondary | ICD-10-CM

## 2017-01-27 LAB — CYTOLOGY - PAP
Diagnosis: NEGATIVE
HPV: NOT DETECTED

## 2017-01-27 MED ORDER — METRONIDAZOLE 500 MG PO TABS: 500.0000 mg | ORAL_TABLET | Freq: Two times a day (BID) | ORAL | 0 refills | Status: AC

## 2017-01-27 NOTE — Telephone Encounter (Signed)
Telephoned patient at home number and advised patient of negative pap smear results. HPV was negative. Next pap smear due in one year. Bacterial vaginosis was seen on pap smear. Medication was called into pharmacy. Patient voiced understanding. Used interpreter Delorise Royals.
